# Patient Record
Sex: Female | Born: 1950 | Race: White | Hispanic: No | Marital: Married | Smoking: Never smoker
Health system: Southern US, Community
[De-identification: ages and names within clinical notes are randomized; demographics above are authoritative.]

## PROBLEM LIST (undated history)

## (undated) DIAGNOSIS — I10 Essential (primary) hypertension: Secondary | ICD-10-CM

## (undated) DIAGNOSIS — C8590 Non-Hodgkin lymphoma, unspecified, unspecified site: Secondary | ICD-10-CM

## (undated) HISTORY — PX: ABDOMINAL HYSTERECTOMY: SHX81

---

## 2008-11-30 ENCOUNTER — Emergency Department (HOSPITAL_BASED_OUTPATIENT_CLINIC_OR_DEPARTMENT_OTHER): Admission: EM | Admit: 2008-11-30 | Discharge: 2008-11-30 | Payer: Self-pay | Admitting: Emergency Medicine

## 2008-11-30 ENCOUNTER — Ambulatory Visit: Payer: Self-pay | Admitting: Interventional Radiology

## 2010-06-24 IMAGING — CR DG FINGER LITTLE 2+V*L*
3 series · 3 of 3 positions shown · non-contrast
Comparison: None

CLINICAL DATA: Injury

LEFT LITTLE FINGER 2+V

[x finger pa left]
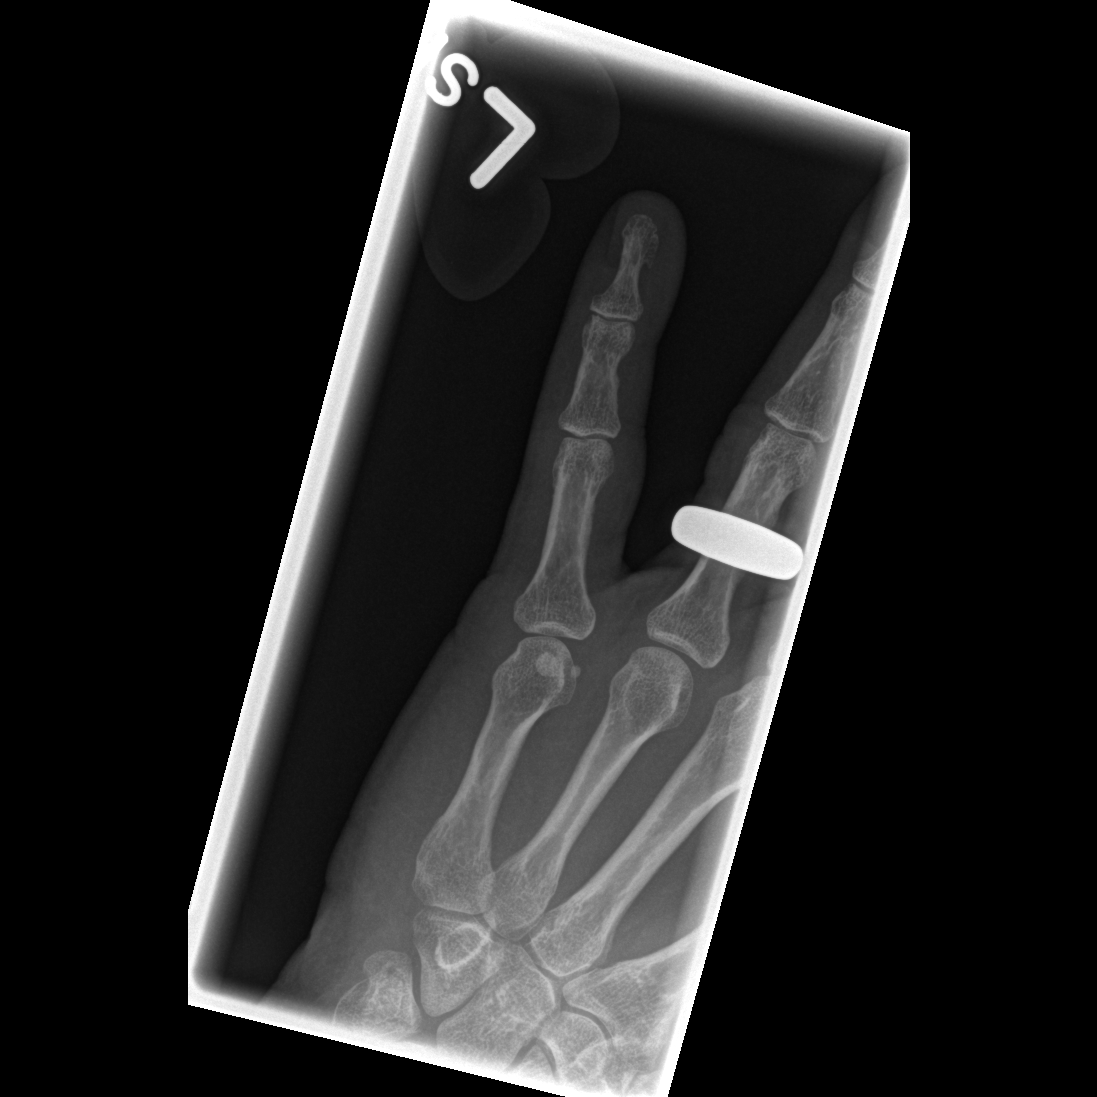

[x finger obl. left]
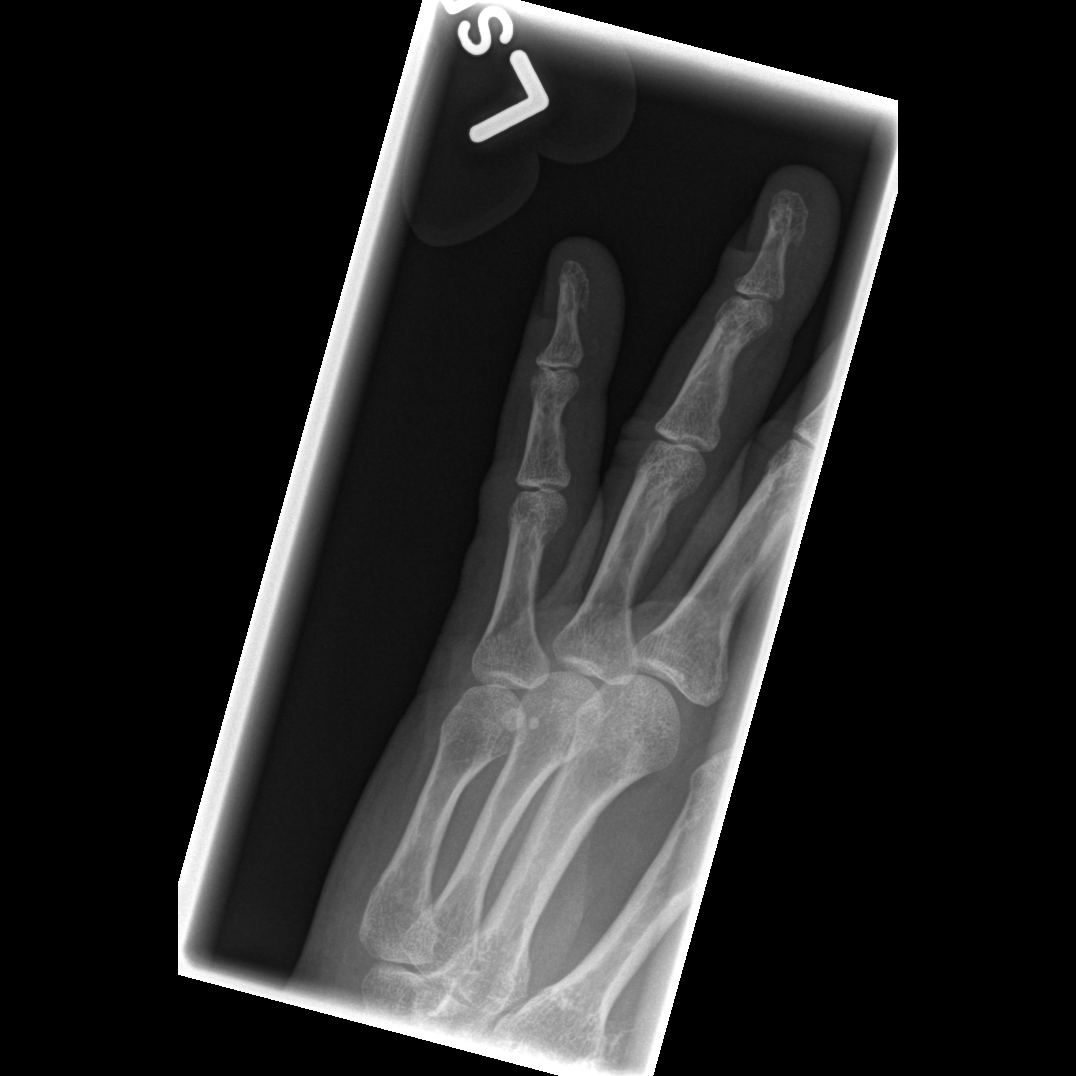

[x finger lateral left]
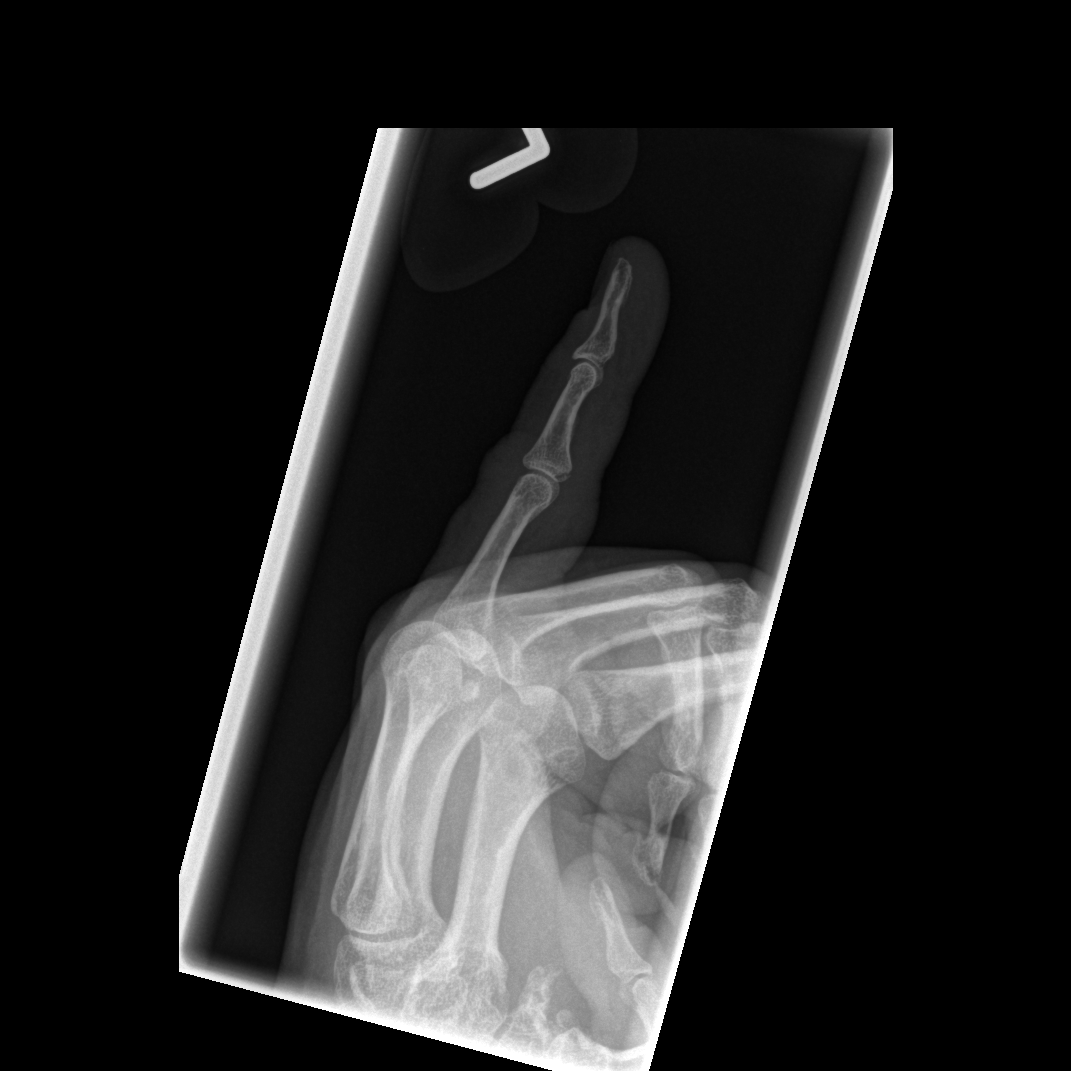

[3 of 3 positions shown; findings below may reference images not displayed]

FINDINGS: There is a small avulsion fracture involving the base of
the middle phalanx on the palmar aspect.  It is minimally
displaced.  It does extend towards the proximal interphalangeal
joint.  Soft tissue swelling is also present.
IMPRESSION: Small fracture involving the middle phalanx of the small finger.

## 2022-03-18 ENCOUNTER — Telehealth: Payer: Self-pay | Admitting: Family Medicine

## 2022-03-18 ENCOUNTER — Emergency Department (INDEPENDENT_AMBULATORY_CARE_PROVIDER_SITE_OTHER)
Admission: EM | Admit: 2022-03-18 | Discharge: 2022-03-18 | Disposition: A | Discharge status: Home / Self Care | Payer: Medicare Other | Source: Home / Self Care

## 2022-03-18 ENCOUNTER — Encounter: Payer: Self-pay | Admitting: *Deleted

## 2022-03-18 DIAGNOSIS — J309 Allergic rhinitis, unspecified: Secondary | ICD-10-CM | POA: Diagnosis not present

## 2022-03-18 DIAGNOSIS — J0100 Acute maxillary sinusitis, unspecified: Secondary | ICD-10-CM | POA: Diagnosis not present

## 2022-03-18 DIAGNOSIS — R059 Cough, unspecified: Secondary | ICD-10-CM

## 2022-03-18 HISTORY — DX: Essential (primary) hypertension: I10

## 2022-03-18 HISTORY — DX: Non-Hodgkin lymphoma, unspecified, unspecified site: C85.90

## 2022-03-18 MED ORDER — AZITHROMYCIN 250 MG PO TABS: 250.0000 mg | ORAL_TABLET | Freq: Every day | ORAL | 0 refills | Status: AC

## 2022-03-18 MED ORDER — BENZONATATE 200 MG PO CAPS: 200.0000 mg | ORAL_CAPSULE | Freq: Three times a day (TID) | ORAL | 0 refills | Status: AC | PRN

## 2022-03-18 MED ORDER — AMOXICILLIN-POT CLAVULANATE 875-125 MG PO TABS: 1.0000 | ORAL_TABLET | Freq: Two times a day (BID) | ORAL | 0 refills | Status: AC

## 2022-03-18 MED ORDER — FEXOFENADINE HCL 180 MG PO TABS: 180.0000 mg | ORAL_TABLET | Freq: Every day | ORAL | 0 refills | Status: AC

## 2022-03-18 NOTE — Telephone Encounter (Signed)
Z-Pak sent to requested pharmacy per patient's request.

## 2022-03-18 NOTE — ED Triage Notes (Signed)
Patient c/o nasal congestion and wet cough x 2 days. Coricidin and Tylenol otc. T-max 100.0

## 2022-03-18 NOTE — ED Provider Notes (Signed)
[facility]    CSN: [id number] Arrival date & time: 03/18/22  0830      History   Chief Complaint Chief Complaint  Patient presents with   Cough   Nasal Congestion    HPI Tammy Vasquez is a 71 y.o. female.   HPI Very pleasant 71 year old female presents with nasal congestion and wet cough for 2 days.  Reports using OTC Coricidin and Tylenol as needed reports temperature max of 100.0.  PMH significant for HTN and lymphoma.  Past Medical History:  Diagnosis Date   Hypertension    Lymphoma ([place])     There are no problems to display for this patient.   Past Surgical History:  Procedure Laterality Date   ABDOMINAL HYSTERECTOMY      OB History   No obstetric history on file.      Home Medications    Prior to Admission medications   Medication Sig Start Date End Date Taking? Authorizing Provider  amLODipine (NORVASC) 5 MG tablet TAKE 1 TABLET(5 MG) BY MOUTH DAILY 11/02/20  Yes [provider]  amoxicillin-clavulanate (AUGMENTIN) 875-125 MG tablet Take 1 tablet by mouth 2 (two) times daily for 10 days. 03/18/22 03/28/22 Yes Eliezer Lofts, FNP  benzonatate (TESSALON) 200 MG capsule Take 1 capsule (200 mg total) by mouth 3 (three) times daily as needed for up to 7 days for cough. 03/18/22 03/25/22 Yes Eliezer Lofts, FNP  fexofenadine [facility] ALLERGY) 180 MG tablet Take 1 tablet (180 mg total) by mouth daily for 15 days. 03/18/22 04/02/22 Yes Eliezer Lofts, FNP  furosemide (LASIX) 20 MG tablet TAKE 1/2 TO 1 TABLET(10 TO 20 MG) BY MOUTH DAILY AS NEEDED FOR LEG SWELLING 06/22/20  Yes [provider]  losartan (COZAAR) 25 MG tablet TAKE 1 TABLET(25 MG) BY MOUTH TWICE DAILY 01/21/21  Yes [provider]  fluticasone (FLONASE) 50 MCG/ACT nasal spray 1 spray.    [provider]  hydrochlorothiazide (HYDRODIURIL) 12.5 MG tablet Take 12.5 mg by mouth daily. 03/14/22   [provider]    Family History Family History  Problem  Relation Age of Onset   Hypertension Mother    Diabetes Father    Heart disease Father     Social History Social History   Tobacco Use   Smoking status: Never   Smokeless tobacco: Never  Vaping Use   Vaping Use: Never used  Substance Use Topics   Alcohol use: Never   Drug use: Never     Allergies   Doxycycline   Review of Systems Review of Systems  HENT:  Positive for congestion and postnasal drip.   Respiratory:  Positive for cough.   All other systems reviewed and are negative.    Physical Exam Triage Vital Signs ED Triage Vitals [03/18/22 0840]  Enc Vitals Group     BP (!) 151/83     Pulse Rate 82     Resp 18     Temp 99.1 F (37.3 C)     Temp Source Oral     SpO2 100 %     Weight      Height      Head Circumference      Peak Flow      Pain Score      Pain Loc      Pain Edu?      Excl. in [place]?    No data found.  Updated Vital Signs BP (!) 151/83 (BP Location: Left Arm)   Pulse 82  Temp 99.1 F (37.3 C) (Oral)   Resp 18   SpO2 100%    Physical Exam Vitals and nursing note reviewed.  Constitutional:      General: She is not in acute distress.    Appearance: Normal appearance. She is not ill-appearing.  HENT:     Head: Normocephalic and atraumatic.     Right Ear: Tympanic membrane and external ear normal.     Left Ear: Tympanic membrane and external ear normal.     Ears:     Comments: Moderate eustachian tube dysfunction noted bilaterally    Nose:     Comments: Turbinates are erythematous/edematous    Mouth/Throat:     Mouth: Mucous membranes are moist.     Pharynx: Oropharynx is clear.     Comments: Moderate amount of clear drainage of posterior oropharynx noted Eyes:     Extraocular Movements: Extraocular movements intact.     Conjunctiva/sclera: Conjunctivae normal.     Pupils: Pupils are equal, round, and reactive to light.  Cardiovascular:     Rate and Rhythm: Normal rate and regular rhythm.     Pulses: Normal pulses.      Heart sounds: Normal heart sounds. No murmur heard. Pulmonary:     Effort: Pulmonary effort is normal.     Breath sounds: Normal breath sounds. No wheezing, rhonchi or rales.     Comments: Infrequent nonproductive cough noted on exam Musculoskeletal:     Cervical back: Normal range of motion and neck supple.  Skin:    General: Skin is warm and dry.  Neurological:     General: No focal deficit present.     Mental Status: She is alert and oriented to person, place, and time.      UC Treatments / Results  Labs (all labs ordered are listed, but only abnormal results are displayed) Labs Reviewed - No data to display  EKG   Radiology No results found.  Procedures Procedures (including critical care time)  Medications Ordered in UC Medications - No data to display  Initial Impression / Assessment and Plan / UC Course  I have reviewed the triage vital signs and the nursing notes.  Pertinent labs & imaging results that were available during my care of the patient were reviewed by me and considered in my medical decision making (see chart for details).     MDM: 1.  Acute maxillary sinusitis, recurrence not specified-Rx'd Augmentin; 2.  Allergic rhinitis-Rx'd Allegra; 3.  Cough-Rx'd Tessalon Perles. Instructed patient to discontinue Flonase and all other OTC medications.  Instructed patient to take medication as directed with food to completion.  Advised take Allegra with first dose of Augmentin for the first 5 of 10 days.  May use Allegra as needed afterwards for concurrent postnasal drainage/drip.  Advised may use Tessalon Perles daily or as needed for cough.  Encouraged increase daily water intake while taking these medications.  Advised patient if symptoms worsen and/or unresolved please follow-up with PCP or here for further evaluation.  Patient discharged home, hemodynamically stable.  Final Clinical Impressions(s) / UC Diagnoses   Final diagnoses:  Acute maxillary sinusitis,  recurrence not specified  Allergic rhinitis, unspecified seasonality, unspecified trigger  Cough, unspecified type     Discharge Instructions      Instructed patient to discontinue Flonase and all other OTC medications.  Instructed patient to take medication as directed with food to completion.  Advised take Allegra with first dose of Augmentin for the first 5 of 10 days.  May use Allegra as needed afterwards for concurrent postnasal drainage/drip.  Advised may use Tessalon Perles daily or as needed for cough.  Encouraged increase daily water intake while taking these medications.  Advised patient if symptoms worsen and/or unresolved please follow-up with PCP or here for further evaluation.     ED Prescriptions     Medication Sig Dispense Auth. Provider   amoxicillin-clavulanate (AUGMENTIN) 875-125 MG tablet Take 1 tablet by mouth 2 (two) times daily for 10 days. 20 tablet Eliezer Lofts, FNP   fexofenadine [facility] ALLERGY) 180 MG tablet Take 1 tablet (180 mg total) by mouth daily for 15 days. 15 tablet Eliezer Lofts, FNP   benzonatate (TESSALON) 200 MG capsule Take 1 capsule (200 mg total) by mouth 3 (three) times daily as needed for up to 7 days for cough. 40 capsule Eliezer Lofts, FNP      PDMP not reviewed this encounter.   Eliezer Lofts, FNP 03/18/22 0930

## 2022-03-18 NOTE — Discharge Instructions (Addendum)
Instructed patient to discontinue Flonase and all other OTC medications.  Instructed patient to take medication as directed with food to completion.  Advised take Allegra with first dose of Augmentin for the first 5 of 10 days.  May use Allegra as needed afterwards for concurrent postnasal drainage/drip.  Advised may use Tessalon Perles daily or as needed for cough.  Encouraged increase daily water intake while taking these medications.  Advised patient if symptoms worsen and/or unresolved please follow-up with PCP or here for further evaluation.
